# Patient Record
Sex: Female | Born: 1970
Health system: Southern US, Community
[De-identification: ages and names within clinical notes are randomized; demographics above are authoritative.]

## PROBLEM LIST (undated history)

## (undated) HISTORY — PX: KNEE SURGERY: SHX244

---

## 2006-04-08 ENCOUNTER — Other Ambulatory Visit: Admission: RE | Admit: 2006-04-08 | Discharge: 2006-04-08 | Payer: Self-pay | Admitting: Family Medicine

## 2009-11-20 ENCOUNTER — Other Ambulatory Visit: Admission: RE | Admit: 2009-11-20 | Discharge: 2009-11-20 | Payer: Self-pay | Admitting: Family Medicine

## 2009-12-05 ENCOUNTER — Ambulatory Visit (HOSPITAL_COMMUNITY): Admission: RE | Admit: 2009-12-05 | Discharge: 2009-12-05 | Payer: Self-pay | Admitting: Family Medicine

## 2010-12-27 ENCOUNTER — Ambulatory Visit (HOSPITAL_COMMUNITY)
Admission: RE | Admit: 2010-12-27 | Discharge: 2010-12-27 | Payer: Self-pay | Source: Home / Self Care | Attending: Family Medicine | Admitting: Family Medicine

## 2011-05-09 ENCOUNTER — Other Ambulatory Visit (HOSPITAL_COMMUNITY)
Admission: RE | Admit: 2011-05-09 | Discharge: 2011-05-09 | Disposition: A | Discharge status: Home / Self Care | Payer: 59 | Source: Ambulatory Visit | Attending: Family Medicine | Admitting: Family Medicine

## 2011-05-09 DIAGNOSIS — Z124 Encounter for screening for malignant neoplasm of cervix: Secondary | ICD-10-CM | POA: Insufficient documentation

## 2012-06-18 ENCOUNTER — Other Ambulatory Visit (HOSPITAL_COMMUNITY)
Admission: RE | Admit: 2012-06-18 | Discharge: 2012-06-18 | Disposition: A | Discharge status: Home / Self Care | Payer: 59 | Source: Ambulatory Visit | Attending: Family Medicine | Admitting: Family Medicine

## 2012-06-18 DIAGNOSIS — Z124 Encounter for screening for malignant neoplasm of cervix: Secondary | ICD-10-CM | POA: Insufficient documentation

## 2015-07-17 ENCOUNTER — Other Ambulatory Visit (HOSPITAL_COMMUNITY)
Admission: RE | Admit: 2015-07-17 | Discharge: 2015-07-17 | Disposition: A | Discharge status: Home / Self Care | Payer: BLUE CROSS/BLUE SHIELD | Source: Ambulatory Visit | Attending: Obstetrics & Gynecology | Admitting: Obstetrics & Gynecology

## 2015-07-17 ENCOUNTER — Other Ambulatory Visit: Payer: Self-pay | Admitting: Obstetrics & Gynecology

## 2015-07-17 DIAGNOSIS — Z1151 Encounter for screening for human papillomavirus (HPV): Secondary | ICD-10-CM | POA: Insufficient documentation

## 2015-07-17 DIAGNOSIS — Z01419 Encounter for gynecological examination (general) (routine) without abnormal findings: Secondary | ICD-10-CM | POA: Insufficient documentation

## 2015-07-19 ENCOUNTER — Other Ambulatory Visit: Payer: Self-pay | Admitting: Physician Assistant

## 2015-07-19 DIAGNOSIS — Z1231 Encounter for screening mammogram for malignant neoplasm of breast: Secondary | ICD-10-CM

## 2015-07-19 LAB — CYTOLOGY - PAP

## 2015-11-28 ENCOUNTER — Other Ambulatory Visit: Payer: Self-pay | Admitting: Family Medicine

## 2015-11-28 DIAGNOSIS — Z1231 Encounter for screening mammogram for malignant neoplasm of breast: Secondary | ICD-10-CM

## 2016-08-19 DIAGNOSIS — K55029 Acute infarction of small intestine, extent unspecified: Secondary | ICD-10-CM | POA: Diagnosis not present

## 2017-01-05 DIAGNOSIS — J069 Acute upper respiratory infection, unspecified: Secondary | ICD-10-CM | POA: Diagnosis not present

## 2017-01-05 DIAGNOSIS — R05 Cough: Secondary | ICD-10-CM | POA: Diagnosis not present

## 2017-01-05 DIAGNOSIS — J00 Acute nasopharyngitis [common cold]: Secondary | ICD-10-CM | POA: Diagnosis not present

## 2017-03-29 ENCOUNTER — Emergency Department (HOSPITAL_BASED_OUTPATIENT_CLINIC_OR_DEPARTMENT_OTHER): Payer: BLUE CROSS/BLUE SHIELD

## 2017-03-29 ENCOUNTER — Emergency Department (HOSPITAL_BASED_OUTPATIENT_CLINIC_OR_DEPARTMENT_OTHER)
Admission: EM | Admit: 2017-03-29 | Discharge: 2017-03-29 | Disposition: A | Discharge status: Home / Self Care | Payer: BLUE CROSS/BLUE SHIELD | Attending: Emergency Medicine | Admitting: Emergency Medicine

## 2017-03-29 ENCOUNTER — Encounter (HOSPITAL_BASED_OUTPATIENT_CLINIC_OR_DEPARTMENT_OTHER): Payer: Self-pay | Admitting: Emergency Medicine

## 2017-03-29 DIAGNOSIS — R072 Precordial pain: Secondary | ICD-10-CM | POA: Diagnosis not present

## 2017-03-29 DIAGNOSIS — R079 Chest pain, unspecified: Secondary | ICD-10-CM | POA: Diagnosis not present

## 2017-03-29 DIAGNOSIS — R0789 Other chest pain: Secondary | ICD-10-CM | POA: Diagnosis not present

## 2017-03-29 LAB — CBC
HCT: 33.7 % — ABNORMAL LOW (ref 36.0–46.0)
Hemoglobin: 11 g/dL — ABNORMAL LOW (ref 12.0–15.0)
MCH: 28.1 pg (ref 26.0–34.0)
MCHC: 32.6 g/dL (ref 30.0–36.0)
MCV: 86 fL (ref 78.0–100.0)
PLATELETS: 261 10*3/uL (ref 150–400)
RBC: 3.92 MIL/uL (ref 3.87–5.11)
RDW: 15.8 % — AB (ref 11.5–15.5)
WBC: 3.3 10*3/uL — AB (ref 4.0–10.5)

## 2017-03-29 LAB — TROPONIN I

## 2017-03-29 LAB — BASIC METABOLIC PANEL
Anion gap: 7 (ref 5–15)
BUN: 11 mg/dL (ref 6–20)
CO2: 24 mmol/L (ref 22–32)
CREATININE: 0.75 mg/dL (ref 0.44–1.00)
Calcium: 10.3 mg/dL (ref 8.9–10.3)
Chloride: 105 mmol/L (ref 101–111)
Glucose, Bld: 133 mg/dL — ABNORMAL HIGH (ref 65–99)
Potassium: 4.2 mmol/L (ref 3.5–5.1)
SODIUM: 136 mmol/L (ref 135–145)

## 2017-03-29 MED ORDER — ASPIRIN 81 MG PO CHEW
324.0000 mg | CHEWABLE_TABLET | Freq: Once | ORAL | Status: AC
  Administered 2017-03-29: 324 mg via ORAL
  Filled 2017-03-29: qty 4

## 2017-03-29 NOTE — ED Notes (Addendum)
EDP at bedside  

## 2017-03-29 NOTE — ED Notes (Signed)
Pt on cardiac monitor and automatic VS 

## 2017-03-29 NOTE — ED Triage Notes (Signed)
Centralized chest pain which started this morning, with associated nausea and diaphoresis, lasting 10 minutes. Pt states she is not having pain right now.

## 2017-03-29 NOTE — ED Provider Notes (Signed)
[facility] Note   CSN: [id number] Arrival date & time: 03/29/17  1020     History   Chief Complaint Chief Complaint  Patient presents with  . Chest Pain    HPI Karen Hansen is a 46 y.o. female.  The history is provided by the patient.  Chest Pain   This is a new problem. The current episode started 1 to 2 hours ago. Episode frequency: twice. The problem has been resolved. The pain is present in the substernal region. The pain is moderate. The quality of the pain is described as sharp. The pain radiates to the mid back. Duration of episode(s) is 15 minutes.    History reviewed. No pertinent past medical history.  There are no active problems to display for this patient.   Past Surgical History:  Procedure Laterality Date  . CESAREAN SECTION      OB History    No data available       Home Medications    Prior to Admission medications   Not on File    Family History No family history on file.  Social History Social History  Substance Use Topics  . Smoking status: Never Smoker  . Smokeless tobacco: Never Used  . Alcohol use Yes     Comment: daily     Allergies   Patient has no known allergies.   Review of Systems Review of Systems  Cardiovascular: Positive for chest pain.  All other systems are reviewed and are negative for acute change except as noted in the HPI    Physical Exam Updated Vital Signs BP (!) 164/92 (BP Location: Right Arm)   Pulse 64   Temp 98 F (36.7 C) (Oral)   Resp (!) 21   Ht  (1.778 m)   Wt 151 lb (68.5 kg)   LMP 03/23/2017   SpO2 100%   BMI 21.67 kg/m   Physical Exam  Constitutional: She is oriented to person, place, and time. She appears well-developed and well-nourished. No distress.  HENT:  Head: Normocephalic and atraumatic.  Nose: Nose normal.  Eyes: Conjunctivae and EOM are normal. Pupils are equal, round, and reactive to light. Right eye exhibits no discharge. Left eye  exhibits no discharge. No scleral icterus.  Neck: Normal range of motion. Neck supple.  Cardiovascular: Normal rate and regular rhythm.  Exam reveals no gallop and no friction rub.   No murmur heard. Pulmonary/Chest: Effort normal and breath sounds normal. No stridor. No respiratory distress. She has no rales.  Abdominal: Soft. She exhibits no distension. There is no tenderness.  Musculoskeletal: She exhibits no edema or tenderness.  Neurological: She is alert and oriented to person, place, and time.  Skin: Skin is warm and dry. No rash noted. She is not diaphoretic. No erythema.  Psychiatric: She has a normal mood and affect.  Vitals reviewed.    ED Treatments / Results  Labs (all labs ordered are listed, but only abnormal results are displayed) Labs Reviewed  BASIC METABOLIC PANEL - Abnormal; Notable for the following:       Result Value   Glucose, Bld 133 (*)    All other components within normal limits  CBC - Abnormal; Notable for the following:    WBC 3.3 (*)    Hemoglobin 11.0 (*)    HCT 33.7 (*)    RDW 15.8 (*)    All other components within normal limits  TROPONIN I  TROPONIN I    EKG  EKG Interpretation  Date/Time:  Sunday March 29 2017 10:28:38 EDT Ventricular Rate:  59 PR Interval:    QRS Duration: 97 QT Interval:  410 QTC Calculation: 407 R Axis:   85 Text Interpretation:  Sinus rhythm Baseline wander in lead(s) V3 NO STEMI No old tracing to compare Confirmed by [facility] MD, Angelice Piech [phone]) on 03/29/2017 10:52:58 AM       Radiology Dg Chest 2 View  Result Date: 03/29/2017 CLINICAL DATA:  Acute onset midsternal chest pain beginning this morning. EXAM: CHEST  2 VIEW COMPARISON:  None. FINDINGS: The heart size and mediastinal contours are within normal limits. Both lungs are clear. The visualized skeletal structures are unremarkable. IMPRESSION: No active cardiopulmonary disease. Electronically Signed   By: Myles Rosenthal M.D.   On: 03/29/2017 11:08     Procedures Procedures (including critical care time)  Medications Ordered in ED Medications  aspirin chewable tablet 324 mg (324 mg Oral Given 03/29/17 1105)     Initial Impression / Assessment and Plan / ED Course  I have reviewed the triage vital signs and the nursing notes.  Pertinent labs & imaging results that were available during my care of the patient were reviewed by me and considered in my medical decision making (see chart for details).     Nonspecific chest pain. EKG without acute ischemic changes or evidence of pericarditis. Initial troponin negative. HEAR <4. Appropriate for delta troponin.  PERC negative- doubt PE.   Presentation not suspicious for esophageal perforation. Low suspicion for aortic dissection.  Chest x-ray without evidence suggestive of pneumonia, pneumothorax, pneumomediastinum.  No abnormal contour of the mediastinum to suggest dissection. No evidence of acute injuries.   Delta trop negative.  The patient is safe for discharge with strict return precautions.   Final Clinical Impressions(s) / ED Diagnoses   Final diagnoses:  Nonspecific chest pain   Disposition: Discharge  Condition: Good  I have discussed the results, Dx and Tx plan with the patient who expressed understanding and agree(s) with the plan. Discharge instructions discussed at great length. The patient was given strict return precautions who verbalized understanding of the instructions. No further questions at time of discharge.    There are no discharge medications for this patient.   Follow Up: Shirlean Mylar, MD [address] [place] [address] [phone]  Schedule an appointment as soon as possible for a visit  For stress testing within 30 days       Nira Conn, MD 03/29/17 1646

## 2017-06-02 DIAGNOSIS — R079 Chest pain, unspecified: Secondary | ICD-10-CM | POA: Diagnosis not present

## 2018-04-24 IMAGING — CR DG CHEST 2V
2 series · 2 of 2 positions shown · non-contrast
Comparison: None.

CLINICAL DATA: Acute onset midsternal chest pain beginning this
morning.

EXAM:
CHEST  2 VIEW

[w chest pa]
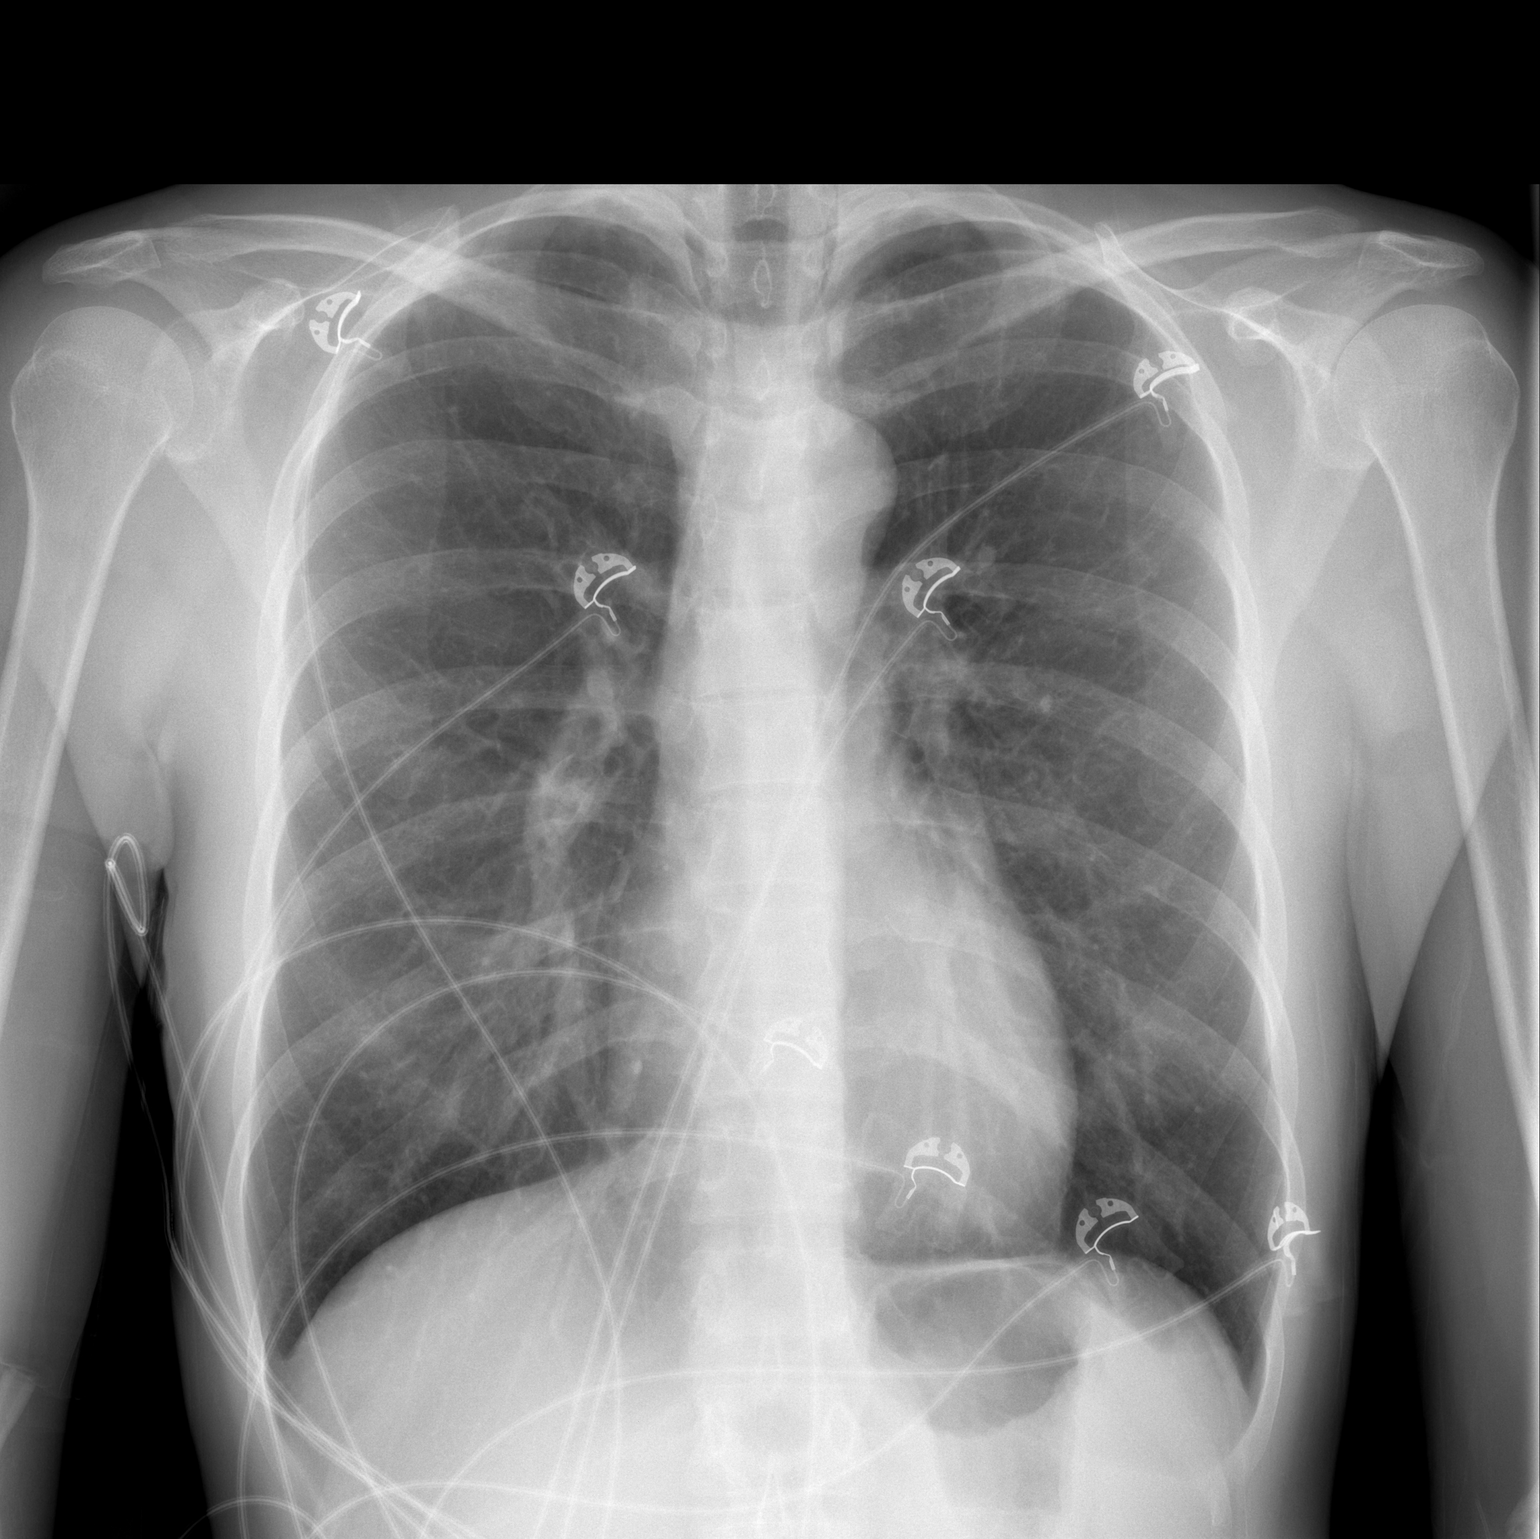

[w chest lat]
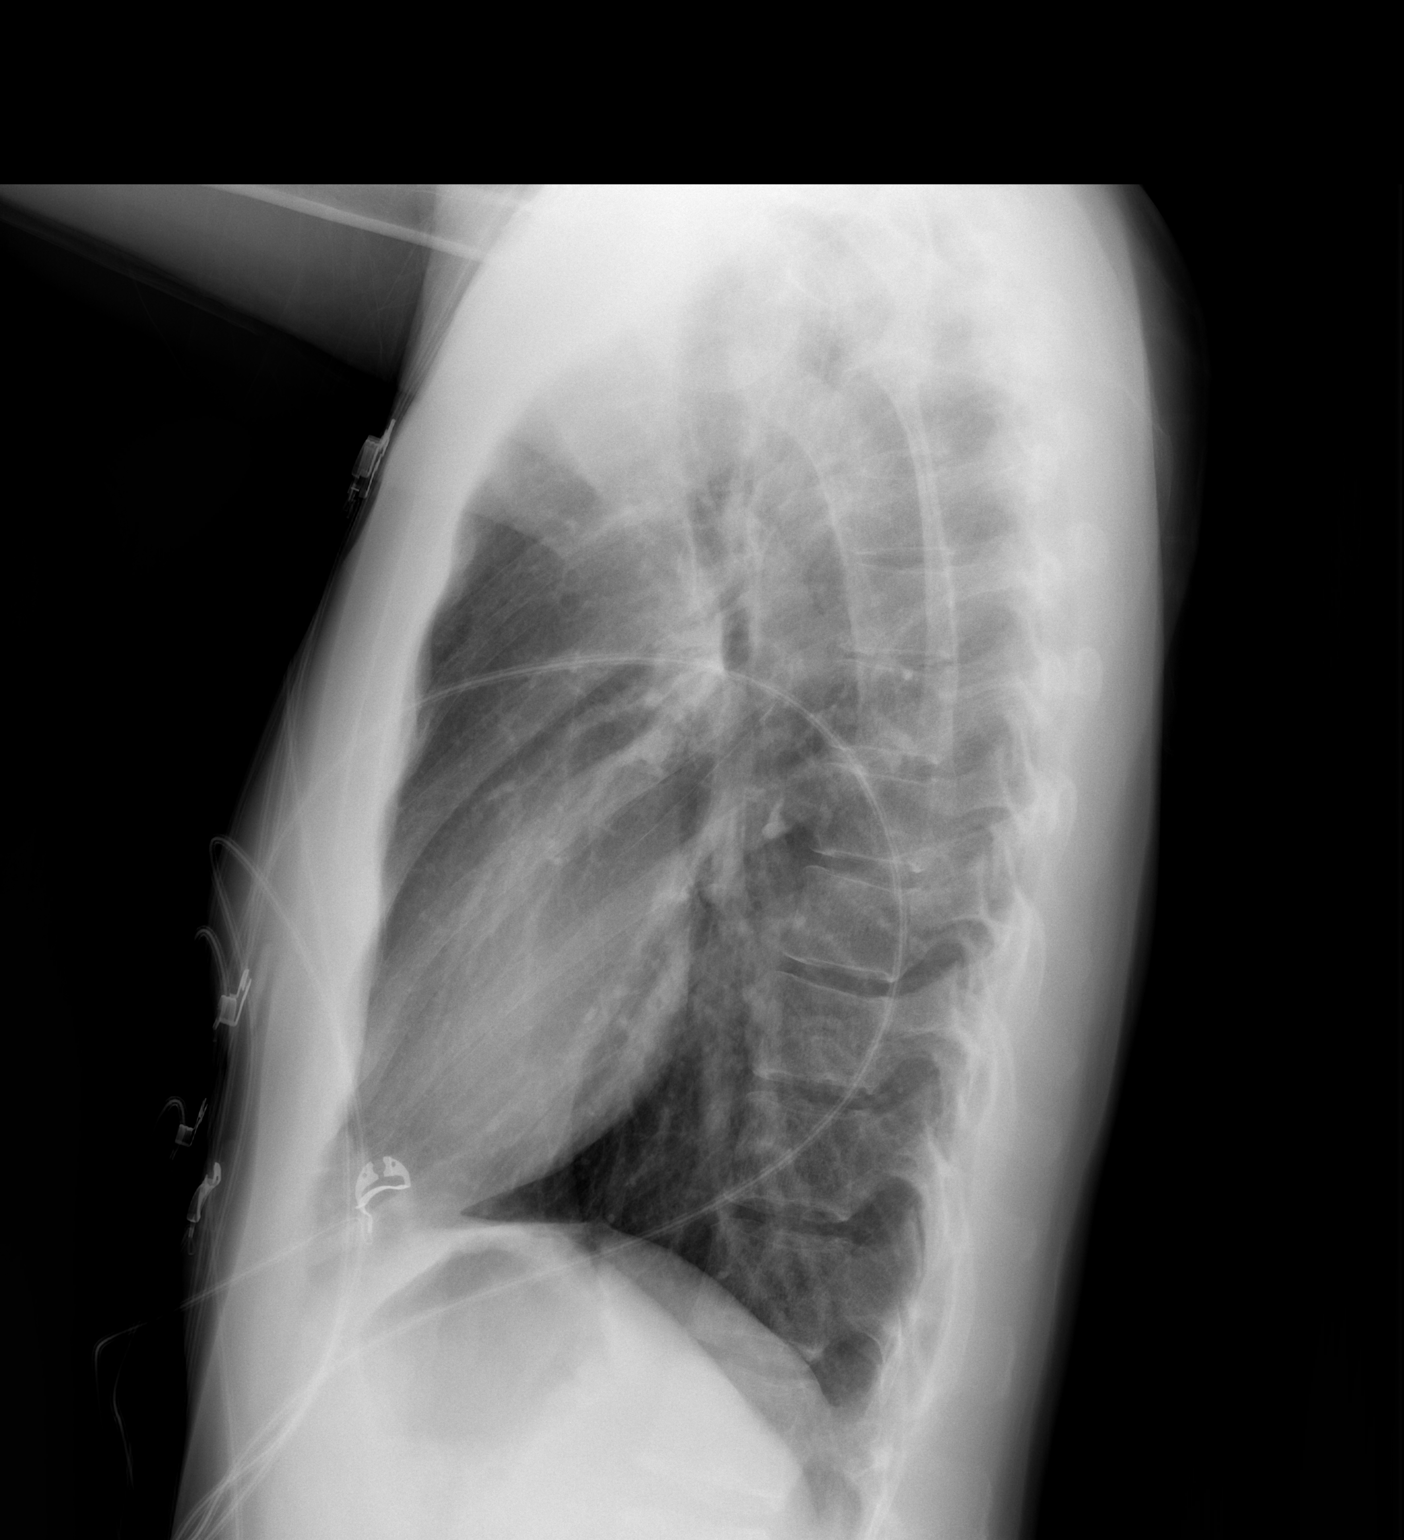

[2 of 2 positions shown; findings below may reference images not displayed]

FINDINGS: The heart size and mediastinal contours are within normal limits.
Both lungs are clear. The visualized skeletal structures are
unremarkable.
IMPRESSION: No active cardiopulmonary disease.

## 2018-05-31 DIAGNOSIS — Z1231 Encounter for screening mammogram for malignant neoplasm of breast: Secondary | ICD-10-CM | POA: Diagnosis not present

## 2018-06-10 DIAGNOSIS — R922 Inconclusive mammogram: Secondary | ICD-10-CM | POA: Diagnosis not present

## 2018-06-28 DIAGNOSIS — Z23 Encounter for immunization: Secondary | ICD-10-CM | POA: Diagnosis not present

## 2018-06-28 DIAGNOSIS — Z13220 Encounter for screening for lipoid disorders: Secondary | ICD-10-CM | POA: Diagnosis not present

## 2018-06-28 DIAGNOSIS — Z0000 Encounter for general adult medical examination without abnormal findings: Secondary | ICD-10-CM | POA: Diagnosis not present

## 2018-06-28 DIAGNOSIS — Z131 Encounter for screening for diabetes mellitus: Secondary | ICD-10-CM | POA: Diagnosis not present

## 2019-01-25 DIAGNOSIS — R921 Mammographic calcification found on diagnostic imaging of breast: Secondary | ICD-10-CM | POA: Diagnosis not present

## 2019-06-06 DIAGNOSIS — Z1231 Encounter for screening mammogram for malignant neoplasm of breast: Secondary | ICD-10-CM | POA: Diagnosis not present

## 2019-06-06 DIAGNOSIS — Z803 Family history of malignant neoplasm of breast: Secondary | ICD-10-CM | POA: Diagnosis not present

## 2019-10-10 DIAGNOSIS — S92514A Nondisplaced fracture of proximal phalanx of right lesser toe(s), initial encounter for closed fracture: Secondary | ICD-10-CM | POA: Diagnosis not present

## 2020-06-14 DIAGNOSIS — Z1231 Encounter for screening mammogram for malignant neoplasm of breast: Secondary | ICD-10-CM | POA: Diagnosis not present

## 2020-06-18 DIAGNOSIS — Z01818 Encounter for other preprocedural examination: Secondary | ICD-10-CM | POA: Diagnosis not present

## 2020-07-11 DIAGNOSIS — R921 Mammographic calcification found on diagnostic imaging of breast: Secondary | ICD-10-CM | POA: Diagnosis not present

## 2020-08-13 DIAGNOSIS — Z1152 Encounter for screening for COVID-19: Secondary | ICD-10-CM | POA: Diagnosis not present

## 2020-08-15 DIAGNOSIS — Z1211 Encounter for screening for malignant neoplasm of colon: Secondary | ICD-10-CM | POA: Diagnosis not present

## 2020-08-15 DIAGNOSIS — K644 Residual hemorrhoidal skin tags: Secondary | ICD-10-CM | POA: Diagnosis not present

## 2020-08-15 DIAGNOSIS — K640 First degree hemorrhoids: Secondary | ICD-10-CM | POA: Diagnosis not present

## 2020-08-15 DIAGNOSIS — K5730 Diverticulosis of large intestine without perforation or abscess without bleeding: Secondary | ICD-10-CM | POA: Diagnosis not present

## 2020-08-20 ENCOUNTER — Other Ambulatory Visit: Payer: Self-pay | Admitting: Radiology

## 2020-08-20 DIAGNOSIS — N6011 Diffuse cystic mastopathy of right breast: Secondary | ICD-10-CM | POA: Diagnosis not present

## 2020-08-20 DIAGNOSIS — R921 Mammographic calcification found on diagnostic imaging of breast: Secondary | ICD-10-CM | POA: Diagnosis not present

## 2020-10-31 ENCOUNTER — Other Ambulatory Visit: Payer: Self-pay | Admitting: Family Medicine

## 2020-10-31 ENCOUNTER — Other Ambulatory Visit (HOSPITAL_COMMUNITY)
Admission: RE | Admit: 2020-10-31 | Discharge: 2020-10-31 | Disposition: A | Discharge status: Home / Self Care | Payer: BC Managed Care – PPO | Source: Ambulatory Visit | Attending: Family Medicine | Admitting: Family Medicine

## 2020-10-31 DIAGNOSIS — Z13220 Encounter for screening for lipoid disorders: Secondary | ICD-10-CM | POA: Diagnosis not present

## 2020-10-31 DIAGNOSIS — Z01411 Encounter for gynecological examination (general) (routine) with abnormal findings: Secondary | ICD-10-CM | POA: Insufficient documentation

## 2020-10-31 DIAGNOSIS — Z0000 Encounter for general adult medical examination without abnormal findings: Secondary | ICD-10-CM | POA: Diagnosis not present

## 2020-10-31 DIAGNOSIS — Z5181 Encounter for therapeutic drug level monitoring: Secondary | ICD-10-CM | POA: Diagnosis not present

## 2020-11-02 LAB — CYTOLOGY - PAP
Comment: NEGATIVE
Diagnosis: NEGATIVE
High risk HPV: NEGATIVE

## 2021-06-18 DIAGNOSIS — R922 Inconclusive mammogram: Secondary | ICD-10-CM | POA: Diagnosis not present

## 2021-09-12 DIAGNOSIS — R000 Tachycardia, unspecified: Secondary | ICD-10-CM | POA: Diagnosis not present

## 2021-09-12 DIAGNOSIS — R9431 Abnormal electrocardiogram [ECG] [EKG]: Secondary | ICD-10-CM | POA: Diagnosis not present

## 2021-09-12 DIAGNOSIS — R002 Palpitations: Secondary | ICD-10-CM | POA: Diagnosis not present

## 2021-09-12 DIAGNOSIS — R0602 Shortness of breath: Secondary | ICD-10-CM | POA: Diagnosis not present

## 2021-11-28 DIAGNOSIS — Z0000 Encounter for general adult medical examination without abnormal findings: Secondary | ICD-10-CM | POA: Diagnosis not present

## 2021-11-28 DIAGNOSIS — Z13220 Encounter for screening for lipoid disorders: Secondary | ICD-10-CM | POA: Diagnosis not present

## 2022-05-15 DIAGNOSIS — N939 Abnormal uterine and vaginal bleeding, unspecified: Secondary | ICD-10-CM | POA: Diagnosis not present

## 2022-05-15 DIAGNOSIS — N951 Menopausal and female climacteric states: Secondary | ICD-10-CM | POA: Diagnosis not present

## 2022-06-19 DIAGNOSIS — Z1231 Encounter for screening mammogram for malignant neoplasm of breast: Secondary | ICD-10-CM | POA: Diagnosis not present

## 2023-01-20 DIAGNOSIS — N926 Irregular menstruation, unspecified: Secondary | ICD-10-CM | POA: Diagnosis not present

## 2023-02-06 DIAGNOSIS — N939 Abnormal uterine and vaginal bleeding, unspecified: Secondary | ICD-10-CM | POA: Diagnosis not present

## 2023-06-03 DIAGNOSIS — Z800 Family history of malignant neoplasm of digestive organs: Secondary | ICD-10-CM | POA: Diagnosis not present

## 2023-06-03 DIAGNOSIS — E559 Vitamin D deficiency, unspecified: Secondary | ICD-10-CM | POA: Diagnosis not present

## 2023-06-03 DIAGNOSIS — E785 Hyperlipidemia, unspecified: Secondary | ICD-10-CM | POA: Diagnosis not present

## 2023-06-03 DIAGNOSIS — Z0000 Encounter for general adult medical examination without abnormal findings: Secondary | ICD-10-CM | POA: Diagnosis not present

## 2023-06-03 DIAGNOSIS — D649 Anemia, unspecified: Secondary | ICD-10-CM | POA: Diagnosis not present

## 2023-06-05 ENCOUNTER — Telehealth: Payer: Self-pay | Admitting: Family Medicine

## 2023-06-05 NOTE — Telephone Encounter (Signed)
Called twice: Left patient a message regarding Genetics appointment times/dates.

## 2023-06-09 ENCOUNTER — Telehealth: Payer: Self-pay | Admitting: Genetic Counselor

## 2023-06-09 NOTE — Telephone Encounter (Signed)
Patient needed appointment rescheduled due to work conflicts, patient is aware of new upcoming appointment times/dates

## 2023-06-10 DIAGNOSIS — D251 Intramural leiomyoma of uterus: Secondary | ICD-10-CM | POA: Diagnosis not present

## 2023-06-10 DIAGNOSIS — D252 Subserosal leiomyoma of uterus: Secondary | ICD-10-CM | POA: Diagnosis not present

## 2023-06-10 NOTE — Progress Notes (Unsigned)
REFERRING PROVIDER: Camie Patience, FNP [address] [place],  [place] [address]  PRIMARY PROVIDER:  Shirlean Mylar, MD  PRIMARY REASON FOR VISIT:  Encounter Diagnoses  Name Primary?   Family history of pancreatic cancer Yes   Family history of breast cancer    Family history of prostate cancer     HISTORY OF PRESENT ILLNESS:   Karen Hansen, a 52 y.o. female, was seen for a [facility] cancer genetics consultation at the request of Jorge Ny, FNP due to a family history of pancreatic cancer.  Karen Hansen presents to clinic today to discuss the possibility of a hereditary predisposition to cancer, to discuss genetic testing, and to further clarify her future cancer risks, as well as potential cancer risks for family members.   Karen Hansen is a 52 y.o. female with no personal history of cancer.    CANCER HISTORY:  Oncology History   No history exists.    RISK FACTORS:  Mammogram within the last year: yes.  R breast biopsy 2021.  Fibrocystic changes.  Colonoscopy: yes;  2021; f/u in 10 years . Hysterectomy. no Ovaries intact: yes Menarche was at age 28.  First live birth at age 1.  Menopausal status: perimenopausal.  OCP use for approximately 12 years.  HRT use: 0 years. Dermatology screening: no    Past Surgical History:  Procedure Laterality Date   CESAREAN SECTION       FAMILY HISTORY:  We obtained a detailed, 4-generation family history.  Significant diagnoses are listed below:  Family History  Problem Relation Age of Onset   Prostate cancer Father        mets; d. 65   Pancreatic cancer Brother 29   Breast cancer Maternal Aunt        dx <50   Breast cancer Cousin        dx early 85s; d. early 16s      Karen Hansen is unaware of previous family history of genetic testing for hereditary cancer risks.  Other relatives are unavailable for genetic testing at this time.    There is no reported Ashkenazi Jewish ancestry. There is no known consanguinity.  GENETIC  COUNSELING ASSESSMENT: Karen Hansen is a 52 y.o. female with a family history which is somewhat suggestive of a hereditary cancer syndrome and predisposition to cancer given the presence of related cancers in the family (pancreatic, metastatic prostate, etc.). We, therefore, discussed and recommended the following at today's visit.   DISCUSSION: We discussed that 5 - 10% of cancer is hereditary, with most cases of hereditary breast, pancreatic, and prostate cancer associated with mutations in BRCA1/2.  There are other genes that can be associated with hereditary breast, pancreatic, or prostate cancer syndromes.  We discussed that testing is beneficial for several reasons, including knowing about other cancer risks, identifying potential screening and risk-reduction options that may be appropriate, and to understanding if other family members could be at risk for cancer and allowing them to undergo genetic testing.  We reviewed the characteristics, features and inheritance patterns of hereditary cancer syndromes. We also discussed genetic testing, including the appropriate family members to test, the process of testing, insurance coverage and turn-around-time for results. We discussed the implications of a negative, positive, and variant of uncertain significant result. We discussed that negative results would be uninformative given that Karen Hansen does not have a personal history of cancer. We recommended Karen Hansen pursue genetic testing for a panel that contains genes associated with breast  cancer, pancreatic cancer, prostate cancer, and other cancers.  Karen Hansen was offered a common hereditary cancer panel (47 genes) and an expanded pan-cancer panel (71 genes). Karen Hansen was informed of the benefits and limitations of each panel, including that expanded pan-cancer panels contain several genes that do not have clear management guidelines at this point in time.  We also discussed that as the number of genes included on  a panel increases, the chances of variants of uncertain significance increases.  After considering the benefits and limitations of each gene panel, Karen Hansen elected to have a common hereditary cancers panel through [employer]   The CustomNext-Cancer+RNAinsight panel offered by [facility] includes sequencing, rearrangement, and RNA analysis for the following 47 genes:  APC, ATM, AXIN2, BAP1, BARD1, BMPR1A, BRCA1, BRCA2, BRIP1, CDH1, CDK4, CDKN2A, CHEK2, CTNNA1, DICER1, EPCAM, FH, GREM1, HOXB13, KIT, MEN1, MLH1, MSH2, MSH3, MSH6, MUTYH, NF1, NTHL1, PALB2, PDGFRA, PMS2, POLD1, POLE, PTEN, RAD51C, RAD51D, SDHA, SDHB, SDHC, SDHD, SMAD4, SMARCA4, STK11, TP53, TSC1, TSC2, VHL.    Based on Karen Hansen's family history of pancreatic cancer in her deceased brother, she meets medical criteria for genetic testing. Despite that she meets criteria, she may still have an out of pocket cost. We discussed that if her out of pocket cost for testing is over $100, the laboratory should contact them to discuss self-pay options and/or patient pay assistance programs.   We discussed the Genetic Information Non-Discrimination Act (GINA) of 2008, which helps protect individuals against genetic discrimination based on their genetic test results.  It impacts both health insurance and employment.  With health insurance, it protects against genetic test results being used for increased premiums or policy termination. For employment, it protects against hiring, firing and promoting decisions based on genetic test results.  GINA does not apply to those in the [employer], those who work for companies with less than 15 employees, and new life insurance or long-term disability insurance policies.  Health status due to a cancer diagnosis is not protected under GINA.  PLAN: After considering the risks, benefits, and limitations, Karen Hansen provided informed consent to pursue genetic testing and the blood sample was sent to [facility]  for analysis of the CustomNext-Cancer +RNAinsight Panel. Results should be available within approximately 3 weeks' time, at which point they will be disclosed by telephone to Karen Hansen, as will any additional recommendations warranted by these results. Karen Hansen will receive a summary of her genetic counseling visit and a copy of her results once available. This information will also be available in Epic.   Karen Hansen questions were answered to her satisfaction today. Our contact information was provided should additional questions or concerns arise. Thank you for the referral and allowing [place] to share in the care of your patient.   Ziyanna Tolin M. Rennie Plowman, MS, [facility] Genetic Counselor [email or web address] (P) [phone]   The patient was seen for a total of 30 minutes in face-to-face genetic counseling.  The patient was seen alone.  Drs. Gunnar Bulla, and/or [place] were available to discuss this case as needed.  _______________________________________________________________________ For Office Staff:  Number of people involved in session: 1 Was an Intern/ student involved with case: no

## 2023-06-11 ENCOUNTER — Other Ambulatory Visit: Payer: Self-pay

## 2023-06-11 ENCOUNTER — Inpatient Hospital Stay: Payer: BC Managed Care – PPO

## 2023-06-11 ENCOUNTER — Other Ambulatory Visit: Payer: Self-pay | Admitting: Genetic Counselor

## 2023-06-11 ENCOUNTER — Inpatient Hospital Stay: Payer: BC Managed Care – PPO | Admitting: Genetic Counselor

## 2023-06-11 DIAGNOSIS — Z800 Family history of malignant neoplasm of digestive organs: Secondary | ICD-10-CM

## 2023-06-11 DIAGNOSIS — Z8042 Family history of malignant neoplasm of prostate: Secondary | ICD-10-CM | POA: Diagnosis not present

## 2023-06-11 DIAGNOSIS — Z803 Family history of malignant neoplasm of breast: Secondary | ICD-10-CM | POA: Diagnosis not present

## 2023-06-11 LAB — GENETIC SCREENING ORDER

## 2023-06-12 ENCOUNTER — Encounter: Payer: Self-pay | Admitting: Genetic Counselor

## 2023-06-25 ENCOUNTER — Ambulatory Visit: Payer: Self-pay | Admitting: Genetic Counselor

## 2023-06-25 ENCOUNTER — Telehealth: Payer: Self-pay | Admitting: Genetic Counselor

## 2023-06-25 DIAGNOSIS — Z1379 Encounter for other screening for genetic and chromosomal anomalies: Secondary | ICD-10-CM

## 2023-06-25 DIAGNOSIS — Z1231 Encounter for screening mammogram for malignant neoplasm of breast: Secondary | ICD-10-CM | POA: Diagnosis not present

## 2023-06-25 DIAGNOSIS — Z8042 Family history of malignant neoplasm of prostate: Secondary | ICD-10-CM

## 2023-06-25 DIAGNOSIS — Z803 Family history of malignant neoplasm of breast: Secondary | ICD-10-CM

## 2023-06-25 DIAGNOSIS — Z800 Family history of malignant neoplasm of digestive organs: Secondary | ICD-10-CM

## 2023-06-25 NOTE — Telephone Encounter (Signed)
Disclosed negative genetics and VUS in SDHA.

## 2023-06-29 ENCOUNTER — Encounter: Payer: Self-pay | Admitting: Genetic Counselor

## 2023-06-29 DIAGNOSIS — Z1379 Encounter for other screening for genetic and chromosomal anomalies: Secondary | ICD-10-CM | POA: Insufficient documentation

## 2023-06-29 NOTE — Progress Notes (Addendum)
HPI:   Karen Hansen was previously seen in the [facility] due to a family history of breast, prostate, and pancreatic cancer and concerns regarding a hereditary predisposition to cancer.    Karen Hansen recent genetic test results were disclosed to her by telephone. These results and recommendations are discussed in more detail below.  CANCER HISTORY:  Oncology History   No history exists.      FAMILY HISTORY:  We obtained a detailed, 4-generation family history.  Significant diagnoses are listed below:        Family History  Problem Relation Age of Onset   Prostate cancer Father          mets; d. 66   Pancreatic cancer Brother 78   Breast cancer Maternal Aunt          dx <50   Breast cancer Cousin          dx early 8s; d. early 53s             Karen Hansen is unaware of previous family history of genetic testing for hereditary cancer risks.  Other relatives are unavailable for genetic testing at this time.      There is no reported Ashkenazi Jewish ancestry. There is no known consanguinity.  GENETIC TEST RESULTS:  The [facility] CustomNext-Cancer +RNAinsight Panel found no pathogenic mutations.   The CustomNext-Cancer+RNAinsight panel offered by [facility] includes sequencing, rearrangement, and RNA analysis for the following 47 genes:  APC, ATM, AXIN2, BAP1, BARD1, BMPR1A, BRCA1, BRCA2, BRIP1, CDH1, CDK4, CDKN2A, CHEK2, CTNNA1, DICER1, EPCAM, FH, GREM1, HOXB13, KIT, MBD4, MEN1, MLH1, MSH2, MSH3, MSH6, MUTYH, NF1, NTHL1, PALB2, PDGFRA, PMS2, POLD1, POLE, PTEN, RAD51C, RAD51D, SDHA, SDHB, SDHC, SDHD, SMAD4, SMARCA4, STK11, TP53, TSC1, TSC2, VHL.     The test report has been scanned into EPIC and is located under the Molecular Pathology section of the Results Review tab.  A portion of the result report is included below for reference. Genetic testing reported out on June 22, 2023.    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  At this time, it is unknown if this variant is associated with an increased risk for cancer or if it is benign, but most uncertain variants are reclassified to benign. It should not be used to make medical management decisions. With time, we suspect the laboratory will determine the significance of this variant, if any. If the laboratory reclassifies this variant, we will attempt to contact Ms. Erman to discuss it further.   January 2025 reclassification: The VUS in SHDA at p.V425M (c.1273G>A) has been reclassified to likely benign.  Amended report date is 12/14/2023.       Even though a pathogenic variant was not identified, possible explanations for the cancer in the family may include: There may be no hereditary risk for cancer in the family. The cancers in Karen Hansen and/or her family may be sporadic/familial or due to other genetic and environmental factors. There may be a gene mutation in one of these genes that current testing methods cannot detect but that chance is small. There could be another gene that has not yet been discovered, or that we have not yet tested, that is responsible for the cancer diagnoses in the family.  It is also possible there is a hereditary cause for the cancer in the family that Ms. Savitt did not inherit.   Therefore, it is important to remain  in touch with cancer genetics in the future so that we can continue to offer Karen Hansen the most up to date genetic testing.    ADDITIONAL GENETIC TESTING:   Karen Hansen genetic testing was fairly extensive.  If there are additional relevant genes identified to increase cancer risk that can be analyzed in the future, we would be happy to discuss and coordinate this testing at that time.     CANCER SCREENING RECOMMENDATIONS:  Karen Hansen's test result is considered negative (normal).  This means that we have not identified a hereditary cause for her family history of cancer at this time.    An individual's cancer risk and medical management are not determined by genetic test results alone. Overall cancer risk assessment incorporates additional factors, including personal medical history, family history, and any available genetic information that may result in a personalized plan for cancer prevention and surveillance. Therefore, it is recommended she continue to follow the cancer management and screening guidelines provided by her primary healthcare provider.  Her lifetime risk for breast cancer based on Tyrer-Cuzick risk model and reported personal/family history is 14%.  This is elevated compared to the general population but not in the 'high risk for breast cancer' category per NCCN guidelines (>20%).  This risk estimate can change over time and may be repeated to reflect new information in her personal or family history in the future.        RECOMMENDATIONS FOR FAMILY MEMBERS:   Since she did not inherit a identifiable mutation in a cancer predisposition gene included on this panel, her children could not have inherited a known mutation from her in one of these genes. Individuals in this family might be at some increased risk of developing cancer, over the general population risk, due to the family history of cancer.  Individuals in the family should notify their providers of the family history of cancer. We recommend women in this family have a yearly mammogram beginning at age 41, or 71 years younger than the earliest onset of cancer, an annual clinical breast exam, and perform monthly breast self-exams.  Risk models that take into account family history and hormonal history may be helpful in determining appropriate breast cancer screening options for family members. Female relatives should speak with their providers about prostate cancer screening.  Other members of the family may still carry a pathogenic variant in one of these genes that Karen Hansen did not inherit. Based on the  family history, we recommend her brother have genetic counseling and testing. Karen Hansen can let us know if we can be of any assistance in coordinating genetic counseling and/or testing for these family member.   We do not recommend familial testing for the SDHA variant of uncertain significance (VUS).  FOLLOW-UP:  Cancer genetics is a rapidly advancing field and it is possible that new genetic tests will be appropriate for her and/or her family members in the future. We encourage Ms. Voit to remain in contact with cancer genetics, so we can update her personal and family histories and let her know of advances in cancer genetics that may benefit this family.   Our contact number was provided.  She knows she is welcome to call us at anytime with additional questions or concerns.   Keondra Haydu M. Rennie Plowman, MS, [facility] Genetic Counselor [email or web address] (P) [phone]

## 2023-07-01 DIAGNOSIS — R922 Inconclusive mammogram: Secondary | ICD-10-CM | POA: Diagnosis not present

## 2023-07-06 DIAGNOSIS — M25562 Pain in left knee: Secondary | ICD-10-CM | POA: Diagnosis not present

## 2023-07-13 ENCOUNTER — Encounter: Payer: BC Managed Care – PPO | Admitting: Licensed Clinical Social Worker

## 2023-07-13 ENCOUNTER — Other Ambulatory Visit: Payer: BC Managed Care – PPO

## 2023-07-15 DIAGNOSIS — M25562 Pain in left knee: Secondary | ICD-10-CM | POA: Diagnosis not present

## 2023-07-30 DIAGNOSIS — M25562 Pain in left knee: Secondary | ICD-10-CM | POA: Diagnosis not present

## 2023-08-05 DIAGNOSIS — M25562 Pain in left knee: Secondary | ICD-10-CM | POA: Diagnosis not present

## 2023-08-20 DIAGNOSIS — S83242A Other tear of medial meniscus, current injury, left knee, initial encounter: Secondary | ICD-10-CM | POA: Diagnosis not present

## 2023-11-30 DIAGNOSIS — M94262 Chondromalacia, left knee: Secondary | ICD-10-CM | POA: Diagnosis not present

## 2023-11-30 DIAGNOSIS — X58XXXA Exposure to other specified factors, initial encounter: Secondary | ICD-10-CM | POA: Diagnosis not present

## 2023-11-30 DIAGNOSIS — M65962 Unspecified synovitis and tenosynovitis, left lower leg: Secondary | ICD-10-CM | POA: Diagnosis not present

## 2023-11-30 DIAGNOSIS — S83242A Other tear of medial meniscus, current injury, left knee, initial encounter: Secondary | ICD-10-CM | POA: Diagnosis not present

## 2023-11-30 DIAGNOSIS — M23322 Other meniscus derangements, posterior horn of medial meniscus, left knee: Secondary | ICD-10-CM | POA: Diagnosis not present

## 2023-11-30 DIAGNOSIS — Y999 Unspecified external cause status: Secondary | ICD-10-CM | POA: Diagnosis not present

## 2023-11-30 DIAGNOSIS — G8918 Other acute postprocedural pain: Secondary | ICD-10-CM | POA: Diagnosis not present

## 2024-03-15 ENCOUNTER — Other Ambulatory Visit: Payer: Self-pay

## 2024-03-15 ENCOUNTER — Emergency Department (HOSPITAL_BASED_OUTPATIENT_CLINIC_OR_DEPARTMENT_OTHER)

## 2024-03-15 ENCOUNTER — Emergency Department (HOSPITAL_BASED_OUTPATIENT_CLINIC_OR_DEPARTMENT_OTHER)
Admission: EM | Admit: 2024-03-15 | Discharge: 2024-03-15 | Disposition: A | Discharge status: Home / Self Care | Attending: Emergency Medicine | Admitting: Emergency Medicine

## 2024-03-15 ENCOUNTER — Encounter (HOSPITAL_BASED_OUTPATIENT_CLINIC_OR_DEPARTMENT_OTHER): Payer: Self-pay | Admitting: Emergency Medicine

## 2024-03-15 DIAGNOSIS — S67192A Crushing injury of right middle finger, initial encounter: Secondary | ICD-10-CM | POA: Diagnosis not present

## 2024-03-15 DIAGNOSIS — S62639A Displaced fracture of distal phalanx of unspecified finger, initial encounter for closed fracture: Secondary | ICD-10-CM

## 2024-03-15 DIAGNOSIS — S62632A Displaced fracture of distal phalanx of right middle finger, initial encounter for closed fracture: Secondary | ICD-10-CM | POA: Diagnosis not present

## 2024-03-15 DIAGNOSIS — W230XXA Caught, crushed, jammed, or pinched between moving objects, initial encounter: Secondary | ICD-10-CM | POA: Insufficient documentation

## 2024-03-15 DIAGNOSIS — Z23 Encounter for immunization: Secondary | ICD-10-CM | POA: Insufficient documentation

## 2024-03-15 DIAGNOSIS — S6710XA Crushing injury of unspecified finger(s), initial encounter: Secondary | ICD-10-CM

## 2024-03-15 DIAGNOSIS — S6991XA Unspecified injury of right wrist, hand and finger(s), initial encounter: Secondary | ICD-10-CM | POA: Diagnosis not present

## 2024-03-15 MED ORDER — BACITRACIN ZINC 500 UNIT/GM EX OINT: TOPICAL_OINTMENT | Freq: Two times a day (BID) | CUTANEOUS | Status: DC

## 2024-03-15 MED ORDER — TETANUS-DIPHTH-ACELL PERTUSSIS 5-2.5-18.5 LF-MCG/0.5 IM SUSY
0.5000 mL | PREFILLED_SYRINGE | Freq: Once | INTRAMUSCULAR | Status: AC
  Administered 2024-03-15: 0.5 mL via INTRAMUSCULAR
  Filled 2024-03-15: qty 0.5

## 2024-03-15 MED ORDER — CEPHALEXIN 500 MG PO CAPS: 500.0000 mg | ORAL_CAPSULE | Freq: Three times a day (TID) | ORAL | 0 refills | Status: AC

## 2024-03-15 NOTE — ED Notes (Signed)
 RN reviewed discharge instructions with pt. Pt verbalized understanding and had no further questions. VSS upon discharge.

## 2024-03-15 NOTE — ED Provider Notes (Signed)
 [facility] [place] EMERGENCY DEPARTMENT AT [facility] Provider Note   CSN: [id number] Arrival date & time: 03/15/24  1546     History  Chief Complaint  Patient presents with   Finger Injury    Karen Hansen is a 53 y.o. female.  53 year old female presents with complaint of right third finger injury.  Patient states that she excellently slammed her finger in the door today.  Right-hand-dominant.  Last tetanus unknown.  No other injuries.  Bleeding controlled.       Home Medications Prior to Admission medications   Medication Sig Start Date End Date Taking? Authorizing Provider  cephALEXin  (KEFLEX ) 500 MG capsule Take 1 capsule (500 mg total) by mouth 3 (three) times daily for 7 days. 03/15/24 03/22/24 Yes Darlis Eisenmenger, PA-C      Allergies    Patient has no known allergies.    Review of Systems   Review of Systems Negative except as per HPI Physical Exam Updated Vital Signs BP (!) 146/97 (BP Location: Left Arm)   Pulse 72   Temp 98 F (36.7 C)   Resp 16   LMP 03/23/2017   SpO2 100%  Physical Exam Vitals and nursing note reviewed.  Constitutional:      General: She is not in acute distress.    Appearance: She is well-developed. She is not diaphoretic.  HENT:     Head: Normocephalic and atraumatic.  Cardiovascular:     Pulses: Normal pulses.  Pulmonary:     Effort: Pulmonary effort is normal.  Musculoskeletal:        General: Swelling, tenderness and signs of injury present.     Comments: Swelling with tenderness to distal right third phalanx. With small subungual hematoma   Skin:    General: Skin is warm and dry.  Neurological:     Mental Status: She is alert and oriented to person, place, and time.     Sensory: No sensory deficit.  Psychiatric:        Behavior: Behavior normal.     ED Results / Procedures / Treatments   Labs (all labs ordered are listed, but only abnormal results are displayed) Labs Reviewed - No data to  display  EKG None  Radiology DG Finger Middle Right Result Date: 03/15/2024 CLINICAL DATA:  closed in door.  Pain with bleeding. EXAM: RIGHT MIDDLE FINGER 2+V COMPARISON:  None Available. FINDINGS: There is comminuted fracture of the tuft the distal phalanx of right middle finger. No other acute fracture or dislocation. No aggressive osseous lesion. No significant arthritis of imaged joints. No radiopaque foreign bodies. Soft tissues are within normal limits. IMPRESSION: Comminuted fracture of the tuft of the distal phalanx of right middle finger. Electronically Signed   By: Beula Brunswick M.D.   On: 03/15/2024 17:27    Procedures Procedures    Medications Ordered in ED Medications  Tdap (BOOSTRIX ) injection 0.5 mL (has no administration in time range)  bacitracin  ointment (has no administration in time range)    ED Course/ Medical Decision Making/ A&P                                 Medical Decision Making Amount and/or Complexity of Data Reviewed Radiology: ordered.  Risk OTC drugs. Prescription drug management.   Right 3rd finger subungual hematoma drained with bovie bed at bedside with small amount of blood present.  Small wound to finger does not require closure.  XR of finger with tuft fracture, agree with radiology interpretation. Discussed technically open tuft fracture with patient.  Plan is to discharge with Keflex , splint finger.  Follow-up with orthopedics for recheck.  Tetanus updated today.  Declines pain medication.        Final Clinical Impression(s) / ED Diagnoses Final diagnoses:  Crushing injury of finger, initial encounter  Closed fracture of tuft of distal phalanx of finger    Rx / DC Orders ED Discharge Orders          Ordered    cephALEXin  (KEFLEX ) 500 MG capsule  3 times daily        03/15/24 1808              Darlis Eisenmenger, PA-C 03/15/24 1814    Sallyanne Creamer, DO 03/20/24 1333

## 2024-03-15 NOTE — ED Triage Notes (Signed)
 Finger slammed in door. Middle right

## 2024-03-15 NOTE — Discharge Instructions (Addendum)
 Keep finger clean and dry. Clean with mild soap.  Take Keflex as prescribed.  Follow up with orthopedics for recheck. Motrin and Tylenol as needed as directed for pain.

## 2024-06-06 DIAGNOSIS — Z0000 Encounter for general adult medical examination without abnormal findings: Secondary | ICD-10-CM | POA: Diagnosis not present

## 2024-06-06 DIAGNOSIS — Z862 Personal history of diseases of the blood and blood-forming organs and certain disorders involving the immune mechanism: Secondary | ICD-10-CM | POA: Diagnosis not present

## 2024-06-06 DIAGNOSIS — Z800 Family history of malignant neoplasm of digestive organs: Secondary | ICD-10-CM | POA: Diagnosis not present

## 2024-06-06 DIAGNOSIS — E785 Hyperlipidemia, unspecified: Secondary | ICD-10-CM | POA: Diagnosis not present

## 2024-06-06 DIAGNOSIS — E559 Vitamin D deficiency, unspecified: Secondary | ICD-10-CM | POA: Diagnosis not present

## 2024-06-09 DIAGNOSIS — Z30432 Encounter for removal of intrauterine contraceptive device: Secondary | ICD-10-CM | POA: Diagnosis not present

## 2024-06-16 ENCOUNTER — Other Ambulatory Visit (HOSPITAL_BASED_OUTPATIENT_CLINIC_OR_DEPARTMENT_OTHER): Payer: Self-pay | Admitting: Family Medicine

## 2024-06-16 DIAGNOSIS — E2839 Other primary ovarian failure: Secondary | ICD-10-CM

## 2024-06-30 DIAGNOSIS — D729 Disorder of white blood cells, unspecified: Secondary | ICD-10-CM | POA: Diagnosis not present

## 2024-06-30 DIAGNOSIS — Z1231 Encounter for screening mammogram for malignant neoplasm of breast: Secondary | ICD-10-CM | POA: Diagnosis not present

## 2024-06-30 DIAGNOSIS — R945 Abnormal results of liver function studies: Secondary | ICD-10-CM | POA: Diagnosis not present

## 2024-10-11 DIAGNOSIS — L578 Other skin changes due to chronic exposure to nonionizing radiation: Secondary | ICD-10-CM | POA: Diagnosis not present

## 2024-10-11 DIAGNOSIS — L814 Other melanin hyperpigmentation: Secondary | ICD-10-CM | POA: Diagnosis not present

## 2024-10-11 DIAGNOSIS — D239 Other benign neoplasm of skin, unspecified: Secondary | ICD-10-CM | POA: Diagnosis not present

## 2024-10-11 DIAGNOSIS — D235 Other benign neoplasm of skin of trunk: Secondary | ICD-10-CM | POA: Diagnosis not present

## 2024-10-11 DIAGNOSIS — D225 Melanocytic nevi of trunk: Secondary | ICD-10-CM | POA: Diagnosis not present

## 2024-10-11 DIAGNOSIS — D485 Neoplasm of uncertain behavior of skin: Secondary | ICD-10-CM | POA: Diagnosis not present

## 2024-11-28 ENCOUNTER — Other Ambulatory Visit (HOSPITAL_BASED_OUTPATIENT_CLINIC_OR_DEPARTMENT_OTHER)

## 2024-11-30 ENCOUNTER — Ambulatory Visit (HOSPITAL_BASED_OUTPATIENT_CLINIC_OR_DEPARTMENT_OTHER)
Admission: RE | Admit: 2024-11-30 | Discharge: 2024-11-30 | Disposition: A | Discharge status: Home / Self Care | Source: Ambulatory Visit | Attending: Family Medicine | Admitting: Family Medicine

## 2024-11-30 DIAGNOSIS — E2839 Other primary ovarian failure: Secondary | ICD-10-CM | POA: Insufficient documentation

## 2024-11-30 DIAGNOSIS — Z780 Asymptomatic menopausal state: Secondary | ICD-10-CM | POA: Diagnosis not present

## 2024-11-30 DIAGNOSIS — M8589 Other specified disorders of bone density and structure, multiple sites: Secondary | ICD-10-CM | POA: Diagnosis not present
